# Patient Record
Sex: Male | Born: 1990 | Race: Black or African American | Hispanic: No | Marital: Single | Smoking: Never smoker
Health system: Southern US, Community
[De-identification: ages and names within clinical notes are randomized; demographics above are authoritative.]

## PROBLEM LIST (undated history)

## (undated) DIAGNOSIS — K589 Irritable bowel syndrome without diarrhea: Secondary | ICD-10-CM

## (undated) HISTORY — DX: Irritable bowel syndrome, unspecified: K58.9

## (undated) HISTORY — PX: NO PAST SURGERIES: SHX2092

---
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
## 2010-04-26 ENCOUNTER — Emergency Department (HOSPITAL_COMMUNITY)
Admission: EM | Admit: 2010-04-26 | Discharge: 2010-04-26 | Disposition: A | Discharge status: Home / Self Care | Payer: Commercial Indemnity | Attending: Emergency Medicine | Admitting: Emergency Medicine

## 2010-04-26 DIAGNOSIS — L298 Other pruritus: Secondary | ICD-10-CM | POA: Insufficient documentation

## 2010-04-26 DIAGNOSIS — H53149 Visual discomfort, unspecified: Secondary | ICD-10-CM | POA: Insufficient documentation

## 2010-04-26 DIAGNOSIS — H5789 Other specified disorders of eye and adnexa: Secondary | ICD-10-CM | POA: Insufficient documentation

## 2010-04-26 DIAGNOSIS — H109 Unspecified conjunctivitis: Secondary | ICD-10-CM | POA: Insufficient documentation

## 2010-04-26 DIAGNOSIS — L2989 Other pruritus: Secondary | ICD-10-CM | POA: Insufficient documentation

## 2012-08-30 ENCOUNTER — Encounter (HOSPITAL_COMMUNITY): Payer: Self-pay | Admitting: *Deleted

## 2012-08-30 ENCOUNTER — Emergency Department (HOSPITAL_COMMUNITY)
Admission: EM | Admit: 2012-08-30 | Discharge: 2012-08-30 | Disposition: A | Discharge status: Home / Self Care | Payer: Commercial Indemnity | Attending: Emergency Medicine | Admitting: Emergency Medicine

## 2012-08-30 DIAGNOSIS — Y92410 Unspecified street and highway as the place of occurrence of the external cause: Secondary | ICD-10-CM | POA: Insufficient documentation

## 2012-08-30 DIAGNOSIS — S0990XA Unspecified injury of head, initial encounter: Secondary | ICD-10-CM | POA: Insufficient documentation

## 2012-08-30 DIAGNOSIS — IMO0002 Reserved for concepts with insufficient information to code with codable children: Secondary | ICD-10-CM | POA: Insufficient documentation

## 2012-08-30 DIAGNOSIS — Y9389 Activity, other specified: Secondary | ICD-10-CM | POA: Insufficient documentation

## 2012-08-30 DIAGNOSIS — S0993XA Unspecified injury of face, initial encounter: Secondary | ICD-10-CM | POA: Insufficient documentation
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
## 2012-09-05 NOTE — ED Provider Notes (Signed)
Medical screening examination/treatment/procedure(s) were performed by non-physician practitioner and as supervising physician I was immediately available for consultation/collaboration.  Lovey Crupi, MD 09/05/12 1829 

## 2012-09-07 ENCOUNTER — Ambulatory Visit (INDEPENDENT_AMBULATORY_CARE_PROVIDER_SITE_OTHER): Payer: Commercial Indemnity | Admitting: Internal Medicine

## 2012-09-07 ENCOUNTER — Encounter: Payer: Self-pay | Admitting: Internal Medicine

## 2012-09-07 VITALS — BP 116/70 | HR 57 | Temp 97.8°F | Ht 75.0 in | Wt 179.0 lb

## 2012-09-07 DIAGNOSIS — M549 Dorsalgia, unspecified: Secondary | ICD-10-CM

## 2012-09-07 DIAGNOSIS — S1093XS Contusion of unspecified part of neck, sequela: Secondary | ICD-10-CM

## 2012-09-07 DIAGNOSIS — IMO0002 Reserved for concepts with insufficient information to code with codable children: Secondary | ICD-10-CM

## 2012-09-07 DIAGNOSIS — T148XXA Other injury of unspecified body region, initial encounter: Secondary | ICD-10-CM

## 2012-09-07 LAB — POCT URINALYSIS DIPSTICK
Bilirubin, UA: NEGATIVE
Blood, UA: NEGATIVE
Glucose, UA: NEGATIVE
Ketones, UA: NEGATIVE
Leukocytes, UA: NEGATIVE
Nitrite, UA: NEGATIVE
Protein, UA: NEGATIVE
Spec Grav, UA: 1.02
Urobilinogen, UA: 0.2
pH, UA: 6

## 2012-09-07 MED ORDER — CYCLOBENZAPRINE HCL 10 MG PO TABS: 10.0000 mg | ORAL_TABLET | Freq: Every evening | ORAL | Status: DC | PRN

## 2012-09-07 NOTE — Patient Instructions (Addendum)
Motrin 200 mg 2 tablets every 6 hours as needed for pain. Always take it with food because may cause gastritis and ulcers. If you notice nausea, stomach pain, change in the color of stools --->  Stop the medicine and let us know Flexeril (muscle relaxant) at night Warm compress to neck if needed Call if not better in 2 weeks
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
## 2012-10-23 ENCOUNTER — Telehealth: Payer: Self-pay

## 2012-10-23 NOTE — Telephone Encounter (Signed)
Medication List and allergies: done  [facility] [address] for local prescriptions  Immunizations due: flu vaccine  Healthy young male

## 2012-10-25 ENCOUNTER — Encounter: Payer: Self-pay | Admitting: Internal Medicine

## 2012-10-25 ENCOUNTER — Ambulatory Visit (INDEPENDENT_AMBULATORY_CARE_PROVIDER_SITE_OTHER): Payer: Commercial Indemnity | Admitting: Internal Medicine

## 2012-10-25 VITALS — BP 119/75 | HR 75 | Temp 98.4°F | Ht 75.0 in | Wt 175.0 lb

## 2012-10-25 DIAGNOSIS — Z0000 Encounter for general adult medical examination without abnormal findings: Secondary | ICD-10-CM

## 2012-10-25 DIAGNOSIS — Z23 Encounter for immunization: Secondary | ICD-10-CM

## 2012-10-25 LAB — LIPID PANEL
Cholesterol: 140 mg/dL (ref 0–200)
HDL: 42.8 mg/dL (ref >39.00)
LDL Cholesterol: 86 mg/dL (ref 0–99)
Total CHOL/HDL Ratio: 3
Triglycerides: 54 mg/dL (ref 0.0–149.0)
VLDL: 10.8 mg/dL (ref 0.0–40.0)

## 2012-10-25 LAB — TSH: TSH: 0.44 u[IU]/mL (ref 0.35–5.50)

## 2012-10-25 LAB — CBC WITH DIFFERENTIAL/PLATELET
Basophils Absolute: 0 10*3/uL (ref 0.0–0.1)
Basophils Relative: 0.3 % (ref 0.0–3.0)
Eosinophils Absolute: 0.1 10*3/uL (ref 0.0–0.7)
Eosinophils Relative: 2.3 % (ref 0.0–5.0)
HCT: 42.4 % (ref 39.0–52.0)
Hemoglobin: 14.3 g/dL (ref 13.0–17.0)
Lymphocytes Relative: 37.8 % (ref 12.0–46.0)
Lymphs Abs: 1.3 10*3/uL (ref 0.7–4.0)
MCHC: 33.7 g/dL (ref 30.0–36.0)
MCV: 95.5 fl (ref 78.0–100.0)
Monocytes Absolute: 0.5 10*3/uL (ref 0.1–1.0)
Monocytes Relative: 13.4 % — ABNORMAL HIGH (ref 3.0–12.0)
Neutro Abs: 1.6 10*3/uL (ref 1.4–7.7)
Neutrophils Relative %: 46.2 % (ref 43.0–77.0)
Platelets: 250 10*3/uL (ref 150.0–400.0)
RBC: 4.44 Mil/uL (ref 4.22–5.81)
RDW: 12.6 % (ref 11.5–14.6)
WBC: 3.5 10*3/uL — ABNORMAL LOW (ref 4.5–10.5)

## 2012-10-25 LAB — COMPREHENSIVE METABOLIC PANEL
ALT: 12 U/L (ref 0–53)
AST: 14 U/L (ref 0–37)
Albumin: 4.6 g/dL (ref 3.5–5.2)
Alkaline Phosphatase: 72 U/L (ref 39–117)
BUN: 17 mg/dL (ref 6–23)
CO2: 30 mEq/L (ref 19–32)
Calcium: 9.7 mg/dL (ref 8.4–10.5)
Chloride: 102 mEq/L (ref 96–112)
Creatinine, Ser: 1.2 mg/dL (ref 0.4–1.5)
GFR: 98.39 mL/min (ref >60.00)
Glucose, Bld: 88 mg/dL (ref 70–99)
Potassium: 3.8 mEq/L (ref 3.5–5.1)
Sodium: 140 mEq/L (ref 135–145)
Total Bilirubin: 0.7 mg/dL (ref 0.3–1.2)
Total Protein: 8.2 g/dL (ref 6.0–8.3)

## 2012-10-25 NOTE — Assessment & Plan Note (Signed)
Reports he had his childhood immunizations. Menactra, HPV--- recommended, asked for records Flu shot today Has excellent lifestyle Counseling: Safe sex, self testicular exam, safe driving here Labs

## 2012-10-25 NOTE — Patient Instructions (Signed)
Get your blood work before you leave  Next visit in 1 year for a physical exam   Please bring to your childhood immunization. I also recommend Menactra and a HPV immunizations, please get the records from the health office in campus    Testicular Problems and Self-Exam Men can examine themselves easily and effectively with positive results. Monthly exams detect problems early and save lives. There are numerous causes of swelling in the testicle. Testicular cancer usually appears as a firm painless lump in the front part of the testicle. This may feel like a dull ache or heavy feeling located in the lower abdomen (belly), groin, or scrotum.  The risk is greater in men with undescended testicles and it is more common in young men. It is responsible for almost a fifth of cancers in males between ages 46 and 11. Other common causes of swellings, lumps, and testicular pain include injuries, inflammation (soreness) from infection, hydrocele, and torsion. These are a few of the reasons to do monthly self-examination of the testicles. The exam only takes minutes and could add years to your life. Get in the habit! SELF-EXAMINATION OF THE TESTICLES The testicles are easiest to examine after warm baths or showers and are more difficult to examine when you are cold. This is because the muscles attached to the testicles retract and pull them up higher or into the abdomen. While standing, roll one testicle between the thumb and forefinger. Feel for lumps, swelling, or discomfort. A normal testicle is egg shaped and feels firm. It is smooth and not tender. The spermatic cord can be felt as a firm spaghetti-like cord at the back of the testicle. It is also important to examine your groins. This is the crease between the front of your leg and your abdomen. Also, feel for enlarged lymph nodes (glands). Enlarged nodes are also a cause for you to see your caregiver for evaluation.  Self-examination of the testicles and  groin areas on a regular basis will help you to know what your own testicles and groins feel like. This will help you pick up an abnormality (difference) at an earlier stage. Early discovery is the key to curing this cancer or treating other conditions. Any lump, change, or swelling in the testicle calls for immediate evaluation by your caregiver. Cancer of the testicle does not result in impotence and it does not prevent normal intercourse or prevent having children. If your caregiver feels that medical treatment or chemotherapy could lead to infertility, sperm can be frozen for future use. It is necessary to see a caregiver as soon as possible after the discovery of a lump in a testicle. Document Released: 04/04/2000 Document Revised: 03/21/2011 Document Reviewed: 12/29/2007 [facility] Patient Information 2014 [place], [place].    Safe Sex Safe sex is about reducing the risk of giving or getting a sexually transmitted disease (STD). STDs are spread through sexual contact involving the genitals, mouth, or rectum. Some STDS can be cured and others cannot. Safe sex can also prevent unintended pregnancies.  SAFE SEX PRACTICES  Limit your sexual activity to only one partner who is only having sex with you.  Talk to your partner about their past partners, past STDs, and drug use.  Use a condom every time you have sexual intercourse. This includes vaginal, oral, and anal sexual activity. Both females and males should wear condoms during oral sex. Only use latex or polyurethane condoms and water-based lubricants. Petroleum-based lubricants or oils used to lubricate a condom will weaken  the condom and increase the chance that it will break. The condom should be in place from the beginning to the end of sexual activity. Wearing a condom reduces, but does not completely eliminate, your risk of getting or giving a STD. STDs can be spread by contact with skin of surrounding areas.  Get vaccinated for hepatitis B  and HPV.  Avoid alcohol and recreational drugs which can affect your judgement. You may forget to use a condom or participate in high-risk sex.  For females, avoid douching after sexual intercourse. Douching can spread an infection farther into the reproductive tract.  Check your body for signs of sores, blisters, rashes, or unusual discharge. See your caregiver if you notice any of these signs.  Avoid sexual contact if you have symptoms of an infection or are being treated for an STD. If you or your partner has herpes, avoid sexual contact when blisters are present. Use condoms at all other times.  See your caregiver for regular screenings, examinations, and tests for STDs. Before having sex with a new partner, each of you should be screened for STDs and talk about the results with your partner. BENEFITS OF SAFE SEX   There is less of a chance of getting or giving an STD.  You can prevent unwanted or unintended pregnancies.  By discussing safer sex concerns with your partner, you may increase feelings of intimacy, comfort, trust, and honesty between the both of you. Document Released: 02/04/2004 Document Revised: 09/21/2011 Document Reviewed: 06/20/2011 [facility] Patient Information 2014 [place], [place].

## 2012-10-25 NOTE — Progress Notes (Signed)
  Subjective:    Patient ID: Louis Ryan, male    DOB: 1990/06/26, 22 y.o.   MRN: [id number]  HPI CPX  Past Medical History  Diagnosis Date  . IBS (irritable bowel syndrome)    Past Surgical History  Procedure Laterality Date  . No past surgeries     Family History  Problem Relation Age of Onset  . Colon cancer Maternal Grandfather   . Prostate cancer Neg Hx   . Diabetes Neg Hx   . CAD Neg Hx    History   Social History  . Marital Status: Single    Spouse Name: N/A    Number of Children: 0  . Years of Education: N/A   Occupational History  . college student [employer]    Social History Main Topics  . Smoking status: Never Smoker   . Smokeless tobacco: Never Used  . Alcohol Use: No  . Drug Use: No  . Sexual Activity: Not on file   Other Topics Concern  . Not on file   Social History Narrative   Lives in campus     Review of Systems Diet-- follows a 2000 cal, healthy Exercise-- runs 2-3 miles qd No  CP, SOB, lower extremity edema Denies  nausea, vomiting, occ diarrhea-cramps w/ IBS Denies  blood in the stools No dysuria, gross hematuria, difficulty urinating   No anxiety, depression     Objective:   Physical Exam BP 119/75  Pulse 75  Temp(Src) 98.4 F (36.9 C)  Ht 6\' 3"  (1.905 m)  Wt 175 lb (79.379 kg)  BMI 21.87 kg/m2  SpO2 99% General -- alert, well-developed, NAD.  Neck --no thyromegaly , normal carotid pulse Lungs -- normal respiratory effort, no intercostal retractions, no accessory muscle use, and normal breath sounds.  Heart-- normal rate, regular rhythm, no murmur.  Abdomen-- Not distended, good bowel sounds,soft, non-tender.  Extremities-- no pretibial edema bilaterally  Neurologic--  alert & oriented X3. Speech normal, gait normal, strength normal in all extremities.  Psych-- Cognition and judgment appear intact. Cooperative with normal attention span and concentration. No anxious appearing , no depressed appearing.     Assessment &  Plan:

## 2012-10-26 LAB — HIV ANTIBODY (ROUTINE TESTING W REFLEX): HIV: NONREACTIVE

## 2012-10-30 ENCOUNTER — Encounter: Payer: Self-pay | Admitting: *Deleted

## 2013-01-21 ENCOUNTER — Telehealth: Payer: Self-pay | Admitting: Internal Medicine

## 2013-01-21 NOTE — Telephone Encounter (Signed)
Its okay. Please schedule.

## 2013-01-21 NOTE — Telephone Encounter (Signed)
Pt's Mom is calling to see if patient can come in for TDap inj before Friday. He needs it for college. Please advise if okay.

## 2013-01-23 ENCOUNTER — Ambulatory Visit (INDEPENDENT_AMBULATORY_CARE_PROVIDER_SITE_OTHER): Payer: BC Managed Care – PPO | Admitting: *Deleted

## 2013-01-23 DIAGNOSIS — Z23 Encounter for immunization: Secondary | ICD-10-CM

## 2013-01-24 ENCOUNTER — Ambulatory Visit: Payer: Commercial Indemnity

## 2013-05-11 ENCOUNTER — Emergency Department (HOSPITAL_COMMUNITY)
Admission: EM | Admit: 2013-05-11 | Discharge: 2013-05-11 | Disposition: A | Discharge status: Home / Self Care | Payer: BC Managed Care – PPO | Source: Home / Self Care | Attending: Emergency Medicine | Admitting: Emergency Medicine

## 2013-05-11 ENCOUNTER — Encounter (HOSPITAL_COMMUNITY): Payer: Self-pay | Admitting: Emergency Medicine

## 2013-05-11 DIAGNOSIS — T148 Other injury of unspecified body region: Secondary | ICD-10-CM

## 2013-05-11 DIAGNOSIS — W57XXXA Bitten or stung by nonvenomous insect and other nonvenomous arthropods, initial encounter: Secondary | ICD-10-CM

## 2013-05-11 MED ORDER — CEPHALEXIN 500 MG PO CAPS: 500.0000 mg | ORAL_CAPSULE | Freq: Three times a day (TID) | ORAL | Status: DC

## 2013-05-11 MED ORDER — TRIAMCINOLONE ACETONIDE 0.1 % EX CREA: 1.0000 "application " | TOPICAL_CREAM | Freq: Three times a day (TID) | CUTANEOUS | Status: DC

## 2013-05-11 MED ORDER — MUPIROCIN 2 % EX OINT: 1.0000 "application " | TOPICAL_OINTMENT | Freq: Three times a day (TID) | CUTANEOUS | Status: DC

## 2013-05-11 MED ORDER — PREDNISONE 20 MG PO TABS: 20.0000 mg | ORAL_TABLET | Freq: Two times a day (BID) | ORAL | Status: DC

## 2013-05-11 NOTE — ED Provider Notes (Signed)
  Chief Complaint    No chief complaint on file.   History of Present Illness      Louis Ryan is a 23 year old male who has a one-week history of a lesion on the left posterior calf. He attributes this to a bug bite, although he did not see anything biting him. It's been itchy and somewhat painful. It's draining some clear fluid. He denies any purulent drainage, fever, chills, headache, or myalgias. He has not been in any wooded areas. He denies difficulty breathing, swelling of the lips, tongue, or throat.  Review of Systems   Other than as noted above, the patient denies any of the following symptoms: Systemic:  No fever or chills. ENT:  No nasal congestion, rhinorrhea, sore throat, swelling of lips, tongue or throat. Resp:  No cough, wheezing, or shortness of breath.  PMFSH    Past medical history, family history, social history, meds, and allergies were reviewed.   Physical Exam     Vital signs:  BP 109/66  Pulse 50  Temp(Src) 98.1 F (36.7 C) (Oral)  Resp 16  SpO2 100% Gen:  Alert, oriented, in no distress. ENT:  Pharynx clear, no intraoral lesions, moist mucous membranes. Lungs:  Clear to auscultation. Skin:  There is an 8 mm ulceration on the left, posterior calf with slight surrounding erythema and minimal induration. There is no purulent drainage or fluctuance.  Assessment    The encounter diagnosis was Insect bite.  Possibly was secondary infection.  Plan     1.  Meds:  The following meds were prescribed:   Discharge Medication List as of 05/11/2013  1:12 PM    START taking these medications   Details  cephALEXin (KEFLEX) 500 MG capsule Take 1 capsule (500 mg total) by mouth 3 (three) times daily., Starting 05/11/2013, Until Discontinued, Normal    mupirocin ointment (BACTROBAN) 2 % Apply 1 application topically 3 (three) times daily., Starting 05/11/2013, Until Discontinued, Normal    predniSONE (DELTASONE) 20 MG tablet Take 1 tablet (20 mg total) by mouth  2 (two) times daily., Starting 05/11/2013, Until Discontinued, Normal    triamcinolone cream (KENALOG) 0.1 % Apply 1 application topically 3 (three) times daily., Starting 05/11/2013, Until Discontinued, Normal        2.  Patient Education/Counseling:  The patient was given appropriate handouts, self care instructions, and instructed in symptomatic relief.    3.  Follow up:  The patient was told to follow up here if no better in 3 to 4 days, or sooner if becoming worse in any way, and given some red flag symptoms such as worsening rash, fever, or difficulty breathing which would prompt immediate return.  Follow up here if necessary.      Reuben Likesavid C Dannis Deroche, MD 05/11/13 [phone]

## 2013-05-11 NOTE — Discharge Instructions (Signed)
Insect Bite  Mosquitoes, flies, fleas, bedbugs, and many other insects can bite. Insect bites are different from insect stings. A sting is when venom is injected into the skin. Some insect bites can transmit infectious diseases.  SYMPTOMS   Insect bites usually turn red, swell, and itch for 2 to 4 days. They often go away on their own.  TREATMENT   Your caregiver may prescribe antibiotic medicines if a bacterial infection develops in the bite.  HOME CARE INSTRUCTIONS   Do not scratch the bite area.   Keep the bite area clean and dry. Wash the bite area thoroughly with soap and water.   Put ice or cool compresses on the bite area.   Put ice in a plastic bag.   Place a towel between your skin and the bag.   Leave the ice on for 20 minutes, 4 times a day for the first 2 to 3 days, or as directed.   You may apply a baking soda paste, cortisone cream, or calamine lotion to the bite area as directed by your caregiver. This can help reduce itching and swelling.   Only take over-the-counter or prescription medicines as directed by your caregiver.   If you are given antibiotics, take them as directed. Finish them even if you start to feel better.  You may need a tetanus shot if:   You cannot remember when you had your last tetanus shot.   You have never had a tetanus shot.   The injury broke your skin.  If you get a tetanus shot, your arm may swell, get red, and feel warm to the touch. This is common and not a problem. If you need a tetanus shot and you choose not to have one, there is a rare chance of getting tetanus. Sickness from tetanus can be serious.  SEEK IMMEDIATE MEDICAL CARE IF:    You have increased pain, redness, or swelling in the bite area.   You see a red line on the skin coming from the bite.   You have a fever.   You have joint pain.   You have a headache or neck pain.   You have unusual weakness.   You have a rash.   You have chest pain or shortness of breath.    You have abdominal pain, nausea, or vomiting.   You feel unusually tired or sleepy.  MAKE SURE YOU:    Understand these instructions.   Will watch your condition.   Will get help right away if you are not doing well or get worse.  Document Released: 02/04/2004 Document Revised: 03/21/2011 Document Reviewed: 07/28/2010  [employer] Patient Information 2014 [employer].

## 2013-05-11 NOTE — ED Notes (Signed)
C/o insect/spider bite behind Left calf for a week.  Denies: SOB, fever, chest pain

## 2013-06-03 ENCOUNTER — Emergency Department (INDEPENDENT_AMBULATORY_CARE_PROVIDER_SITE_OTHER)
Admission: EM | Admit: 2013-06-03 | Discharge: 2013-06-03 | Disposition: A | Discharge status: Home / Self Care | Payer: BC Managed Care – PPO | Source: Home / Self Care | Attending: Family Medicine | Admitting: Family Medicine

## 2013-06-03 ENCOUNTER — Encounter (HOSPITAL_COMMUNITY): Payer: Self-pay | Admitting: Emergency Medicine

## 2013-06-03 DIAGNOSIS — L509 Urticaria, unspecified: Secondary | ICD-10-CM

## 2013-06-03 MED ORDER — PREDNISONE 5 MG PO KIT: PACK | ORAL | Status: DC

## 2013-06-03 NOTE — ED Provider Notes (Signed)
Louis Ryan is a 23 y.o. male who presents to Urgent Care today for urticaria starting today. No itching mouth swelling tongue swelling or trouble breathing. No new medications detergents shampoos etc. No medications yet. Patient feels well otherwise.   Past Medical History  Diagnosis Date  . IBS (irritable bowel syndrome)    History  Substance Use Topics  . Smoking status: Never Smoker   . Smokeless tobacco: Never Used  . Alcohol Use: No   ROS as above Medications: No current facility-administered medications for this encounter.   Current Outpatient Prescriptions  Medication Sig Dispense Refill  . PredniSONE 5 MG KIT 12 day dosepack po  1 kit  0    Exam:  BP 122/82  Pulse 55  Temp(Src) 98 F (36.7 C) (Oral)  Resp 16  SpO2 100% Gen: Well NAD HEENT: EOMI,  MMM no mouth or tongue involvement. Lungs: Normal work of breathing. CTABL Heart: RRR no MRG Abd: NABS, Soft. NT, ND Exts: Brisk capillary refill, warm and well perfused.  Skin: Scattered urticaria present on the trunk and extremities  No results found for this or any previous visit (from the past 24 hour(s)). No results found.  Assessment and Plan: 23 y.o. male with urticaria. Unclear etiology. Plan for treatment with prednisone, cetirizine, and Benadryl.  Discussed warning signs or symptoms. Please see discharge instructions. Patient expresses understanding.    Gregor Hams, MD 06/03/13 [phone]

## 2013-06-03 NOTE — Discharge Instructions (Signed)
Thank you for coming in today. Take Zyrtec (cetirizine) daily. Use Benadryl as needed Call or go to the emergency room if you get worse, have trouble breathing, have chest pains, or palpitations.  Hives Hives are itchy, red, swollen areas of the skin. They can vary in size and location on your body. Hives can come and go for hours or several days (acute hives) or for several weeks (chronic hives). Hives do not spread from person to person (noncontagious). They may get worse with scratching, exercise, and emotional stress. CAUSES   Allergic reaction to food, additives, or drugs.  Infections, including the common cold.  Illness, such as vasculitis, lupus, or thyroid disease.  Exposure to sunlight, heat, or cold.  Exercise.  Stress.  Contact with chemicals. SYMPTOMS   Red or white swollen patches on the skin. The patches may change size, shape, and location quickly and repeatedly.  Itching.  Swelling of the hands, feet, and face. This may occur if hives develop deeper in the skin. DIAGNOSIS  Your caregiver can usually tell what is wrong by performing a physical exam. Skin or blood tests may also be done to determine the cause of your hives. In some cases, the cause cannot be determined. TREATMENT  Mild cases usually get better with medicines such as antihistamines. Severe cases may require an emergency epinephrine injection. If the cause of your hives is known, treatment includes avoiding that trigger.  HOME CARE INSTRUCTIONS   Avoid causes that trigger your hives.  Take antihistamines as directed by your caregiver to reduce the severity of your hives. Non-sedating or low-sedating antihistamines are usually recommended. Do not drive while taking an antihistamine.  Take any other medicines prescribed for itching as directed by your caregiver.  Wear loose-fitting clothing.  Keep all follow-up appointments as directed by your caregiver. SEEK MEDICAL CARE IF:   You have  persistent or severe itching that is not relieved with medicine.  You have painful or swollen joints. SEEK IMMEDIATE MEDICAL CARE IF:   You have a fever.  Your tongue or lips are swollen.  You have trouble breathing or swallowing.  You feel tightness in the throat or chest.  You have abdominal pain. These problems may be the first sign of a life-threatening allergic reaction. Call your local emergency services (911 in [place] MAKE SURE YOU:   Understand these instructions.  Will watch your condition.  Will get help right away if you are not doing well or get worse. Document Released: 12/27/2004 Document Revised: 06/28/2011 Document Reviewed: 03/22/2011 [facility] Patient Information 2014 [place], [place].

## 2013-06-03 NOTE — ED Notes (Signed)
Pt c/o hives onset this am around 0530 on arms, chest, legs Denies f/v/n/d, dyspnea Has applied OTC itching cream w/some relief Alert w/no signs of acute distress

## 2016-01-18 ENCOUNTER — Telehealth: Payer: Self-pay | Admitting: Behavioral Health

## 2016-01-18 NOTE — Telephone Encounter (Signed)
Unable to reach patient at time of Pre-Visit Call.  Left message for patient to return call when available.    

## 2016-01-19 ENCOUNTER — Ambulatory Visit: Payer: Self-pay | Admitting: Internal Medicine

## 2016-01-19 ENCOUNTER — Encounter: Payer: Self-pay | Admitting: Internal Medicine

## 2016-01-19 ENCOUNTER — Ambulatory Visit (INDEPENDENT_AMBULATORY_CARE_PROVIDER_SITE_OTHER): Payer: BC Managed Care – PPO | Admitting: Internal Medicine

## 2016-01-19 VITALS — BP 108/76 | HR 59 | Temp 97.8°F | Resp 14 | Ht 76.0 in | Wt 214.0 lb

## 2016-01-19 DIAGNOSIS — Z09 Encounter for follow-up examination after completed treatment for conditions other than malignant neoplasm: Secondary | ICD-10-CM | POA: Insufficient documentation

## 2016-01-19 DIAGNOSIS — Z0000 Encounter for general adult medical examination without abnormal findings: Secondary | ICD-10-CM

## 2016-01-19 DIAGNOSIS — Z23 Encounter for immunization: Secondary | ICD-10-CM | POA: Diagnosis not present

## 2016-01-19 NOTE — Assessment & Plan Note (Signed)
Reports he had his childhood immunizations. Last Td  2015. Outgrowth need for Menactra, HPV  Flu shot today Counseled - diet, exercise, safe sex, self testicular exam, safe driving here Labs BMP, FLP, CBC, TSH

## 2016-01-19 NOTE — Progress Notes (Signed)
Subjective:    Patient ID: Louis Ryan, male    DOB: [birth year], 26 y.o.   MRN: [id number]  DOS:  01/19/2016 Type of visit - description : CPX, new patient Interval history: Since the last time I saw him, he is doing well, to finish college this year.   Review of Systems  Constitutional: No fever. No chills. No unexplained wt changes. No unusual sweats  HEENT: No dental problems, no ear discharge, no facial swelling, no voice changes. No eye discharge, no eye  redness , no  intolerance to light   Respiratory: No wheezing , no  difficulty breathing. No cough , no mucus production  Cardiovascular: No CP, no leg swelling , no  Palpitations  GI:  Occasional heartburn, usually in the morning has a burning feeling in the throat. Triggers are certain foods like spices-acidic foods and eating late. Occasionally associated with nausea. no vomiting, no diarrhea , no  abdominal pain.  No blood in the stools. No dysphagia, no odynophagia    Endocrine: No polyphagia, no polyuria , no polydipsia  GU: No dysuria, gross hematuria, difficulty urinating. No urinary urgency, no frequency.  Musculoskeletal: No joint swellings or unusual aches or pains  Skin: No change in the color of the skin, palor , no  Rash  Allergic, immunologic: No environmental allergies , no  food allergies  Neurological: No dizziness no  syncope. No headaches. No diplopia, no slurred, no slurred speech, no motor deficits, no facial  Numbness  Hematological: No enlarged lymph nodes, no easy bruising , no unusual bleedings  Psychiatry: No suicidal ideas, no hallucinations, no beavior problems, no confusion.  No unusual/severe anxiety, no depression    Past Medical History:  Diagnosis Date  . IBS (irritable bowel syndrome)     Past Surgical History:  Procedure Laterality Date  . NO PAST SURGERIES      Social History   Social History  . Marital status: Single    Spouse name: N/A  . Number of children:  0  . Years of education: N/A   Occupational History  . college student [employer]    Social History Main Topics  . Smoking status: Never Smoker  . Smokeless tobacco: Never Used  . Alcohol use No  . Drug use: No  . Sexual activity: Not on file   Other Topics Concern  . Not on file   Social History Narrative   Lives off  campus      Family History  Problem Relation Age of Onset  . Colon cancer Maternal Grandfather   . Prostate cancer Neg Hx   . Diabetes Neg Hx   . CAD Neg Hx      Allergies as of 01/19/2016   No Known Allergies     Medication List       Accurate as of 01/19/16  5:28 PM. Always use your most recent med list.          PredniSONE 5 MG Kit 12 day dosepack po          Objective:   Physical Exam BP 108/76 (BP Location: Left Arm, Patient Position: Sitting, Cuff Size: Normal)   Pulse (!) 59   Temp 97.8 F (36.6 C) (Oral)   Resp 14   Ht _0  (1.93 m)   Wt 214 lb (97.1 kg)   SpO2 98%   BMI 26.05 kg/m   General:   Well developed, well nourished . NAD.  Neck: No  thyromegaly  HEENT:  Normocephalic . Face symmetric, atraumatic Lungs:  CTA B Normal respiratory effort, no intercostal retractions, no accessory muscle use. Heart: RRR,  no murmur.  No pretibial edema bilaterally  Abdomen:  Not distended, soft, non-tender. No rebound or rigidity.   Skin: Exposed areas without rash. Not pale. Not jaundice Neurologic:  alert & oriented X3.  Speech normal, gait appropriate for age and unassisted Strength symmetric and appropriate for age.  Psych: Cognition and judgment appear intact.  Cooperative with normal attention span and concentration.  Behavior appropriate. No anxious or depressed appearing.    Assessment & Plan:   Assessment IBS GERD  PLAN: History of IBS, not an issue at this time GERD: Occasional sx, recommend prevention by avoiding spicy or acidic foods, to take Tums or prilosec OTC. Call if symptoms persistent RTC one year

## 2016-01-19 NOTE — Progress Notes (Signed)
Pre visit review using our clinic review tool, if applicable. No additional management support is needed unless otherwise documented below in the visit note. 

## 2016-01-19 NOTE — Assessment & Plan Note (Signed)
History of IBS, not an issue at this time GERD: Occasional sx, recommend prevention by avoiding spicy or acidic foods, to take Tums or prilosec OTC. Call if symptoms persistent RTC one year

## 2016-01-19 NOTE — Patient Instructions (Signed)
GO TO THE LAB : Get the blood work     GO TO THE FRONT DESK Schedule your next appointment for a  physical exam in one year, fasting   Acid reflux Watch your diet to prevent acid reflux  If you have mild symptoms, take Tums OTC  If you have more symptoms: Take over-the-counter Prilosec 20 mg one tablet before breakfast every day for 2 or 3 weeks.  If symptoms severe or persistent : let me know    Safe Sex Safe sex is about reducing the risk of giving or getting a sexually transmitted disease (STD). STDs are spread through sexual contact involving the genitals, mouth, or rectum. Some STDs can be cured and others cannot. Safe sex can also prevent unintended pregnancies.  WHAT ARE SOME SAFE SEX PRACTICES?  Limit your sexual activity to only one partner who is having sex with only you.  Talk to your partner about his or her past partners, past STDs, and drug use.  Use a condom every time you have sexual intercourse. This includes vaginal, oral, and anal sexual activity. Both females and males should wear condoms during oral sex. Only use latex or polyurethane condoms and water-based lubricants. Using petroleum-based lubricants or oils to lubricate a condom will weaken the condom and increase the chance that it will break. The condom should be in place from the beginning to the end of sexual activity. Wearing a condom reduces, but does not completely eliminate, your risk of getting or giving an STD. STDs can be spread by contact with infected body fluids and skin.  Get vaccinated for hepatitis B and HPV.  Avoid alcohol and recreational drugs, which can affect your judgment. You may forget to use a condom or participate in high-risk sex.  For females, avoid douching after sexual intercourse. Douching can spread an infection farther into the reproductive tract.  Check your body for signs of sores, blisters, rashes, or unusual discharge. See your health care provider if you notice any of  these signs.  Avoid sexual contact if you have symptoms of an infection or are being treated for an STD. If you or your partner has herpes, avoid sexual contact when blisters are present. Use condoms at all other times.  If you are at risk of being infected with HIV, it is recommended that you take a prescription medicine daily to prevent HIV infection. This is called pre-exposure prophylaxis (PrEP). You are considered at risk if:  You are a man who has sex with other men (MSM).  You are a heterosexual man or woman who is sexually active with more than one partner.  You take drugs by injection.  You are sexually active with a partner who has HIV.  Talk with your health care provider about whether you are at high risk of being infected with HIV. If you choose to begin PrEP, you should first be tested for HIV. You should then be tested every 3 months for as long as you are taking PrEP.  See your health care provider for regular screenings, exams, and tests for other STDs. Before having sex with a new partner, each of you should be screened for STDs and should talk about the results with each other. WHAT ARE THE BENEFITS OF SAFE SEX?   There is less chance of getting or giving an STD.  You can prevent unwanted or unintended pregnancies.  By discussing safe sex concerns with your partner, you may increase feelings of intimacy, comfort, trust, and  honesty between the two of you. This information is not intended to replace advice given to you by your health care provider. Make sure you discuss any questions you have with your health care provider. Document Released: 02/04/2004 Document Revised: 01/17/2014 Document Reviewed: 11/16/2014 Elsevier Interactive Patient Education  2017 [employer]      Testicular Self-Exam A self-examination of your testicles involves looking at and feeling your testicles for abnormal lumps or swelling. Several things can cause swelling, lumps, or pain in your  testicles. Some of these causes are:  Injuries.  Inflammation.  Infection.  Accumulation of fluids around your testicle (hydrocele).  Twisted testicles (testicular torsion).  Testicular cancer. Self-examination of the testicles and groin areas may be advised if you are at risk for testicular cancer. Risks for testicular cancer include:  An undescended testicle (cryptorchidism).  A history of previous testicular cancer.  A family history of testicular cancer. The testicles are easiest to examine after warm baths or showers and are more difficult to examine when you are cold. This is because the muscles attached to the testicles retract and pull them up higher or into the abdomen. Follow these steps while you are standing:  Hold your penis away from your body.  Roll one testicle between your thumb and forefinger, feeling the entire testicle.  Roll the other testicle between your thumb and forefinger, feeling the entire testicle. Feel for lumps, swelling, or discomfort. A normal testicle is egg shaped and feels firm. It is smooth and not tender. The spermatic cord can be felt as a firm spaghetti-like cord at the back of your testicle. It is also important to examine the crease between the front of your leg and your abdomen. Feel for any bumps that are tender. These could be enlarged lymph nodes.  This information is not intended to replace advice given to you by your health care provider. Make sure you discuss any questions you have with your health care provider. Document Released: 04/04/2000 Document Revised: 08/29/2012 Document Reviewed: 06/18/2012 Elsevier Interactive Patient Education  2017 [employer].

## 2016-01-20 LAB — BASIC METABOLIC PANEL
BUN: 15 mg/dL (ref 6–23)
CO2: 27 mEq/L (ref 19–32)
CREATININE: 1 mg/dL (ref 0.40–1.50)
Calcium: 9.9 mg/dL (ref 8.4–10.5)
Chloride: 102 mEq/L (ref 96–112)
GFR: 115.95 mL/min (ref >60.00)
Glucose, Bld: 79 mg/dL (ref 70–99)
Potassium: 3.9 mEq/L (ref 3.5–5.1)
Sodium: 140 mEq/L (ref 135–145)

## 2016-01-20 LAB — CBC WITH DIFFERENTIAL/PLATELET
Basophils Absolute: 0 10*3/uL (ref 0.0–0.1)
Basophils Relative: 0.6 % (ref 0.0–3.0)
EOS ABS: 0 10*3/uL (ref 0.0–0.7)
Eosinophils Relative: 0.6 % (ref 0.0–5.0)
HCT: 40.9 % (ref 39.0–52.0)
HEMOGLOBIN: 14.1 g/dL (ref 13.0–17.0)
LYMPHS PCT: 32.1 % (ref 12.0–46.0)
Lymphs Abs: 1.7 10*3/uL (ref 0.7–4.0)
MCHC: 34.3 g/dL (ref 30.0–36.0)
MCV: 93.9 fl (ref 78.0–100.0)
MONO ABS: 0.6 10*3/uL (ref 0.1–1.0)
Monocytes Relative: 11.3 % (ref 3.0–12.0)
Neutro Abs: 2.9 10*3/uL (ref 1.4–7.7)
Neutrophils Relative %: 55.4 % (ref 43.0–77.0)
Platelets: 313 10*3/uL (ref 150.0–400.0)
RBC: 4.36 Mil/uL (ref 4.22–5.81)
RDW: 12.9 % (ref 11.5–15.5)
WBC: 5.2 10*3/uL (ref 4.0–10.5)

## 2016-01-20 LAB — TSH: TSH: 0.67 u[IU]/mL (ref 0.35–4.50)

## 2016-01-20 LAB — LIPID PANEL
Cholesterol: 216 mg/dL — ABNORMAL HIGH (ref 0–200)
HDL: 48.1 mg/dL (ref >39.00)
LDL CALC: 160 mg/dL — AB (ref 0–99)
NonHDL: 168.35
Total CHOL/HDL Ratio: 5
Triglycerides: 43 mg/dL (ref 0.0–149.0)
VLDL: 8.6 mg/dL (ref 0.0–40.0)

## 2018-08-07 ENCOUNTER — Encounter: Payer: Self-pay | Admitting: Internal Medicine

## 2021-01-20 ENCOUNTER — Other Ambulatory Visit: Payer: Self-pay

## 2021-01-20 ENCOUNTER — Ambulatory Visit
Admission: EM | Admit: 2021-01-20 | Discharge: 2021-01-20 | Disposition: A | Discharge status: Home / Self Care | Payer: BC Managed Care – PPO | Attending: Emergency Medicine | Admitting: Emergency Medicine

## 2021-01-20 DIAGNOSIS — B309 Viral conjunctivitis, unspecified: Secondary | ICD-10-CM

## 2021-01-20 MED ORDER — OLOPATADINE HCL 0.1 % OP SOLN: 1.0000 [drp] | Freq: Two times a day (BID) | OPHTHALMIC | 0 refills | Status: AC

## 2021-01-20 NOTE — ED Provider Notes (Signed)
[facility]    CSN: [id number] Arrival date & time: 01/20/21  1045    HISTORY   Chief Complaint  Patient presents with   Conjunctivitis   HPI Louis Ryan is a 31 y.o. male. Pt reports getting over a cold and is now having drainage from right eye with soreness.  States initially this morning the eye was crusted shut, after washing his face and removing the crust he has not noticed any excess drainage from the eye.  Patient states he usually wears contacts but chose not to wear them today because of the redness and irritation.  Patient denies eye pain, changes in his vision.  Patient endorses a mild scratchy foreign body type sensation but states this is not bothersome.  The history is provided by the patient.  Past Medical History:  Diagnosis Date   IBS (irritable bowel syndrome)    Patient Active Problem List   Diagnosis Date Noted   PCP NOTES >>>>>>>>>>>>>>>> 01/19/2016   Annual physical exam 10/25/2012   Past Surgical History:  Procedure Laterality Date   NO PAST SURGERIES      Home Medications    Prior to Admission medications   Not on File   Family History Family History  Problem Relation Age of Onset   Colon cancer Maternal Grandfather    Prostate cancer Neg Hx    Diabetes Neg Hx    CAD Neg Hx    Social History Social History   Tobacco Use   Smoking status: Never   Smokeless tobacco: Never  Substance Use Topics   Alcohol use: No   Drug use: No   Allergies   Patient has no known allergies.  Review of Systems Review of Systems Pertinent findings noted in history of present illness.   Physical Exam Triage Vital Signs ED Triage Vitals  Enc Vitals Group     BP 11/06/20 0827 (!) 147/82     Pulse Rate 11/06/20 0827 72     Resp 11/06/20 0827 18     Temp 11/06/20 0827 98.3 F (36.8 C)     Temp Source 11/06/20 0827 Oral     SpO2 11/06/20 0827 98 %     Weight --      Height --      Head Circumference --      Peak Flow --      Pain  Score 11/06/20 0826 5     Pain Loc --      Pain Edu? --      Excl. in GC? --   No data found.  Updated Vital Signs BP 130/82 (BP Location: Left Arm)    Pulse 65    Temp 98.5 F (36.9 C) (Oral)    Resp 18    SpO2 96%   Physical Exam Vitals and nursing note reviewed.  Constitutional:      General: He is not in acute distress.    Appearance: Normal appearance. He is not ill-appearing.  HENT:     Head: Normocephalic and atraumatic.     Salivary Glands: Right salivary gland is not diffusely enlarged or tender. Left salivary gland is not diffusely enlarged or tender.     Right Ear: Tympanic membrane, ear canal and external ear normal. No drainage. No middle ear effusion. There is no impacted cerumen. Tympanic membrane is not erythematous or bulging.     Left Ear: Tympanic membrane, ear canal and external ear normal. No drainage.  No middle ear effusion. There is no  impacted cerumen. Tympanic membrane is not erythematous or bulging.     Nose: Nose normal. No nasal deformity, septal deviation, mucosal edema, congestion or rhinorrhea.     Right Turbinates: Not enlarged, swollen or pale.     Left Turbinates: Not enlarged, swollen or pale.     Right Sinus: No maxillary sinus tenderness or frontal sinus tenderness.     Left Sinus: No maxillary sinus tenderness or frontal sinus tenderness.     Mouth/Throat:     Lips: Pink. No lesions.     Mouth: Mucous membranes are moist. No oral lesions.     Pharynx: Oropharynx is clear. Uvula midline. No posterior oropharyngeal erythema or uvula swelling.     Tonsils: No tonsillar exudate. 0 on the right. 0 on the left.  Eyes:     General: Lids are normal. Lids are everted, no foreign bodies appreciated. Vision grossly intact. Gaze aligned appropriately. No visual field deficit or scleral icterus.       Right eye: No foreign body, discharge or hordeolum.        Left eye: No foreign body, discharge or hordeolum.     Extraocular Movements: Extraocular movements  intact.     Right eye: Normal extraocular motion and no nystagmus.     Left eye: Normal extraocular motion and no nystagmus.     Conjunctiva/sclera:     Right eye: Right conjunctiva is injected. No exudate or hemorrhage.    Left eye: Left conjunctiva is not injected. No exudate or hemorrhage. Neck:     Trachea: Trachea and phonation normal.  Cardiovascular:     Rate and Rhythm: Normal rate and regular rhythm.     Pulses: Normal pulses.     Heart sounds: Normal heart sounds. No murmur heard.   No friction rub. No gallop.  Pulmonary:     Effort: Pulmonary effort is normal. No accessory muscle usage, prolonged expiration or respiratory distress.     Breath sounds: Normal breath sounds. No stridor, decreased air movement or transmitted upper airway sounds. No decreased breath sounds, wheezing, rhonchi or rales.  Chest:     Chest wall: No tenderness.  Musculoskeletal:        General: Normal range of motion.     Cervical back: Normal range of motion and neck supple. Normal range of motion.  Lymphadenopathy:     Cervical: No cervical adenopathy.  Skin:    General: Skin is warm and dry.     Findings: No erythema or rash.  Neurological:     General: No focal deficit present.     Mental Status: He is alert and oriented to person, place, and time.  Psychiatric:        Mood and Affect: Mood normal.        Behavior: Behavior normal.    Visual Acuity Right Eye Distance:   Left Eye Distance:   Bilateral Distance:    Right Eye Near:   Left Eye Near:    Bilateral Near:     UC Couse / Diagnostics / Procedures:    EKG  Radiology No results found.  Procedures Procedures (including critical care time)  UC Diagnoses / Final Clinical Impressions(s)   I have reviewed the triage vital signs and the nursing notes.  Pertinent labs & imaging results that were available during my care of the patient were reviewed by me and considered in my medical decision making (see chart for details).    Final diagnoses:  Viral conjunctivitis of right eye  Begin Pataday eyedrops.  Provided to return to work.  Patient advised not to use contacts until symptoms resolve.  Return precautions advised.  ED Prescriptions     Medication Sig Dispense Auth. Provider   olopatadine (PATANOL) 0.1 % ophthalmic solution Place 1 drop into both eyes 2 (two) times daily. 5 mL Theadora Rama Scales, PA-C      PDMP not reviewed this encounter.  Pending results:  Labs Reviewed - No data to display  Medications Ordered in UC: Medications - No data to display  Disposition Upon Discharge:  Condition: stable for discharge home Home: take medications as prescribed; routine discharge instructions as discussed; follow up as advised.  Patient presented with an acute illness with associated systemic symptoms and significant discomfort requiring urgent management. In my opinion, this is a condition that a prudent lay person (someone who possesses an average knowledge of health and medicine) may potentially expect to result in complications if not addressed urgently such as respiratory distress, impairment of bodily function or dysfunction of bodily organs.   Routine symptom specific, illness specific and/or disease specific instructions were discussed with the patient and/or caregiver at length.   As such, the patient has been evaluated and assessed, work-up was performed and treatment was provided in alignment with urgent care protocols and evidence based medicine.  Patient/parent/caregiver has been advised that the patient may require follow up for further testing and treatment if the symptoms continue in spite of treatment, as clinically indicated and appropriate.  If the patient was tested for COVID-19, Influenza and/or RSV, then the patient/parent/guardian was advised to isolate at home pending the results of his/her diagnostic coronavirus test and potentially longer if theyre positive. I have also advised  pt that if his/her COVID-19 test returns positive, it's recommended to self-isolate for at least 10 days after symptoms first appeared AND until fever-free for 24 hours without fever reducer AND other symptoms have improved or resolved. Discussed self-isolation recommendations as well as instructions for household member/close contacts as per the [facility] and [place] DHHS, and also gave patient the COVID packet with this information.  Patient/parent/caregiver has been advised to return to the [facility] or PCP in 3-5 days if no better; to PCP or the Emergency Department if new signs and symptoms develop, or if the current signs or symptoms continue to change or worsen for further workup, evaluation and treatment as clinically indicated and appropriate  The patient will follow up with their current PCP if and as advised. If the patient does not currently have a PCP we will assist them in obtaining one.   The patient may need specialty follow up if the symptoms continue, in spite of conservative treatment and management, for further workup, evaluation, consultation and treatment as clinically indicated and appropriate.  Patient/parent/caregiver verbalized understanding and agreement of plan as discussed.  All questions were addressed during visit.  Please see discharge instructions below for further details of plan.  Discharge Instructions:   Discharge Instructions      Please begin olopatadine eyedrops, 1 drop in each eye twice daily.  I recommend that you do not resume contact use until your eye is completely improved.  If you do not have complete improvement of your symptoms in the next 3 to 5 days or if you begin to have eye pain, vision changes or worsening discharge from your eyes, please either follow-up with the emergency room or go to walk-in ophthalmology clinic as the new symptoms would be concerning for a more serious  eye infection.  Thank you for visiting urgent care today.  I hope you feel better  soon.      This office note has been dictated using [employer] speech recognition software.  Unfortunately, and despite my best efforts, this method of dictation can sometimes lead to occasional typographical or grammatical errors.  I apologize in advance if this occurs.     Theadora RamaMorgan, Abbie Berling Scales, PA-C 01/20/21 1134

## 2021-01-20 NOTE — ED Triage Notes (Signed)
Pt reports getting over a cold and is now having drainage from right eye with soreness.

## 2021-01-20 NOTE — Discharge Instructions (Signed)
Please begin olopatadine eyedrops, 1 drop in each eye twice daily.  I recommend that you do not resume contact use until your eye is completely improved.  If you do not have complete improvement of your symptoms in the next 3 to 5 days or if you begin to have eye pain, vision changes or worsening discharge from your eyes, please either follow-up with the emergency room or go to walk-in ophthalmology clinic as the new symptoms would be concerning for a more serious eye infection.  Thank you for visiting urgent care today.  I hope you feel better soon.
